# Patient Record
Sex: Female | Born: 1990 | Race: Black or African American | Hispanic: No | Marital: Single | Smoking: Current every day smoker
Health system: Southern US, Community
[De-identification: ages and names within clinical notes are randomized; demographics above are authoritative.]

## PROBLEM LIST (undated history)

## (undated) DIAGNOSIS — N201 Calculus of ureter: Secondary | ICD-10-CM

## (undated) DIAGNOSIS — M1990 Unspecified osteoarthritis, unspecified site: Secondary | ICD-10-CM

## (undated) DIAGNOSIS — R300 Dysuria: Secondary | ICD-10-CM

## (undated) DIAGNOSIS — N3289 Other specified disorders of bladder: Secondary | ICD-10-CM

## (undated) DIAGNOSIS — T8859XA Other complications of anesthesia, initial encounter: Secondary | ICD-10-CM

## (undated) DIAGNOSIS — K219 Gastro-esophageal reflux disease without esophagitis: Secondary | ICD-10-CM

---
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
## 2012-09-17 ENCOUNTER — Ambulatory Visit: Payer: Self-pay | Admitting: Orthopedic Surgery

## 2013-07-21 ENCOUNTER — Emergency Department (HOSPITAL_COMMUNITY)
Admission: EM | Admit: 2013-07-21 | Discharge: 2013-07-21 | Discharge status: Against Medical Advice | Payer: Medicaid Other | Attending: Emergency Medicine | Admitting: Emergency Medicine

## 2013-07-21 ENCOUNTER — Encounter (HOSPITAL_COMMUNITY): Payer: Self-pay | Admitting: Emergency Medicine

## 2013-07-21 DIAGNOSIS — R197 Diarrhea, unspecified: Secondary | ICD-10-CM | POA: Insufficient documentation

## 2013-07-21 DIAGNOSIS — R1110 Vomiting, unspecified: Secondary | ICD-10-CM | POA: Insufficient documentation

## 2013-07-21 DIAGNOSIS — R109 Unspecified abdominal pain: Secondary | ICD-10-CM | POA: Insufficient documentation

## 2013-07-21 DIAGNOSIS — F17200 Nicotine dependence, unspecified, uncomplicated: Secondary | ICD-10-CM | POA: Insufficient documentation

## 2013-07-21 NOTE — ED Notes (Signed)
Pt with abd pain with N/V/D since last night

## 2013-07-21 NOTE — ED Notes (Signed)
Reported that pt left.  

## 2014-08-21 ENCOUNTER — Emergency Department (HOSPITAL_COMMUNITY)
Admission: EM | Admit: 2014-08-21 | Discharge: 2014-08-21 | Disposition: A | Discharge status: Home / Self Care | Payer: Medicaid Other | Attending: Emergency Medicine | Admitting: Emergency Medicine

## 2014-08-21 ENCOUNTER — Encounter (HOSPITAL_COMMUNITY): Payer: Self-pay | Admitting: *Deleted

## 2014-08-21 DIAGNOSIS — Z79899 Other long term (current) drug therapy: Secondary | ICD-10-CM | POA: Insufficient documentation

## 2014-08-21 DIAGNOSIS — Z720 Tobacco use: Secondary | ICD-10-CM | POA: Insufficient documentation

## 2014-08-21 DIAGNOSIS — A5901 Trichomonal vulvovaginitis: Secondary | ICD-10-CM | POA: Insufficient documentation

## 2014-08-21 LAB — WET PREP, GENITAL: YEAST WET PREP: NONE SEEN

## 2014-08-21 MED ORDER — METRONIDAZOLE 500 MG PO TABS: 500.0000 mg | ORAL_TABLET | Freq: Two times a day (BID) | ORAL | Status: DC

## 2014-08-21 NOTE — Discharge Instructions (Signed)
Be sure your sex partner if treated for trichomonas. Men do not usually have symptoms and can re infect you if not treated. Follow up with the health department for additional screening such as HIV, Hepatitis and Syphilis.  We will only call you if you cultures for GC and Chlamydia are positive.   Trichomoniasis Trichomoniasis is an infection caused by an organism called Trichomonas. The infection can affect both women and men. In women, the outer female genitalia and the vagina are affected. In men, the penis is mainly affected, but the prostate and other reproductive organs can also be involved. Trichomoniasis is a sexually transmitted infection (STI) and is most often passed to another person through sexual contact.  RISK FACTORS  Having unprotected sexual intercourse.  Having sexual intercourse with an infected partner. SIGNS AND SYMPTOMS  Symptoms of trichomoniasis in women include:  Abnormal gray-green frothy vaginal discharge.  Itching and irritation of the vagina.  Itching and irritation of the area outside the vagina. Symptoms of trichomoniasis in men include:   Penile discharge with or without pain.  Pain during urination. This results from inflammation of the urethra. DIAGNOSIS  Trichomoniasis may be found during a Pap test or physical exam. Your health care provider may use one of the following methods to help diagnose this infection:  Examining vaginal discharge under a microscope. For men, urethral discharge would be examined.  Testing the pH of the vagina with a test tape.  Using a vaginal swab test that checks for the Trichomonas organism. A test is available that provides results within a few minutes.  Doing a culture test for the organism. This is not usually needed. TREATMENT   You may be given medicine to fight the infection. Women should inform their health care provider if they could be or are pregnant. Some medicines used to treat the infection should not be  taken during pregnancy.  Your health care provider may recommend over-the-counter medicines or creams to decrease itching or irritation.  Your sexual partner will need to be treated if infected. HOME CARE INSTRUCTIONS   Take medicines only as directed by your health care provider.  Take over-the-counter medicine for itching or irritation as directed by your health care provider.  Do not have sexual intercourse while you have the infection.  Women should not douche or wear tampons while they have the infection.  Discuss your infection with your partner. Your partner may have gotten the infection from you, or you may have gotten it from your partner.  Have your sex partner get examined and treated if necessary.  Practice safe, informed, and protected sex.  See your health care provider for other STI testing. SEEK MEDICAL CARE IF:   You still have symptoms after you finish your medicine.  You develop abdominal pain.  You have pain when you urinate.  You have bleeding after sexual intercourse.  You develop a rash.  Your medicine makes you sick or makes you throw up (vomit). MAKE SURE YOU:  Understand these instructions.  Will watch your condition.  Will get help right away if you are not doing well or get worse. Document Released: 01/17/2001 Document Revised: 12/08/2013 Document Reviewed: 05/05/2013 [facility] Patient Information 2015 [place], [place]. This information is not intended to replace advice given to you by your health care provider. Make sure you discuss any questions you have with your health care provider.  Sexually Transmitted Disease A sexually transmitted disease (STD) is a disease or infection often passed to another person  during sex. However, STDs can be passed through nonsexual ways. An STD can be passed through:  Spit (saliva).  Semen.  Blood.  Mucus from the vagina.  Pee (urine). HOW CAN I LESSEN MY CHANCES OF GETTING AN STD?  Use:  Latex  condoms.  Water-soluble lubricants with condoms. Do not use petroleum jelly or oils.  Dental dams. These are small pieces of latex that are used as a barrier during oral sex.  Avoid having more than one sex partner.  Do not have sex with someone who has other sex partners.  Do not have sex with anyone you do not know or who is at high risk for an STD.  Avoid risky sex that can break your skin.  Do not have sex if you have open sores on your mouth or skin.  Avoid drinking too much alcohol or taking illegal drugs. Alcohol and drugs can affect your good judgment.  Avoid oral and anal sex acts.  Get shots (vaccines) for HPV and hepatitis.  If you are at risk of being infected with HIV, it is advised that you take a certain medicine daily to prevent HIV infection. This is called pre-exposure prophylaxis (PrEP). You may be at risk if:  You are a man who has sex with other men (MSM).  You are attracted to the opposite sex (heterosexual) and are having sex with more than one partner.  You take drugs with a needle.  You have sex with someone who has HIV.  Talk with your doctor about if you are at high risk of being infected with HIV. If you begin to take PrEP, get tested for HIV first. Get tested every 3 months for as long as you are taking PrEP. WHAT SHOULD I DO IF I THINK I HAVE AN STD?  See your doctor.  Tell your sex partner(s) that you have an STD. They should be tested and treated.  Do not have sex until your doctor says it is okay. WHEN SHOULD I GET HELP? Get help right away if:  You have bad belly (abdominal) pain.  You are a man and have puffiness (swelling) or pain in your testicles.  You are a woman and have puffiness in your vagina. Document Released: 08/31/2004 Document Revised: 07/29/2013 Document Reviewed: 01/17/2013 [facility] Patient Information 2015 [place], [place]. This information is not intended to replace advice given to you by your health care provider.  Make sure you discuss any questions you have with your health care provider.

## 2014-08-21 NOTE — ED Notes (Signed)
Pt co vaginal rash/redness with white discharge, started Sunday.

## 2014-08-21 NOTE — ED Notes (Signed)
Vag d/c for several days, pt thinks due to change in fabric softener.  Says she is "very irritated and itchy".

## 2014-08-21 NOTE — ED Provider Notes (Signed)
CSN: [id number]     Arrival date & time 08/21/14  1154 History   First MD Initiated Contact with Patient 08/21/14 1240     Chief Complaint  Patient presents with  . Vaginal Discharge     (Consider location/radiation/quality/duration/timing/severity/associated sxs/prior Treatment) Patient is a 24 y.o. female presenting with vaginal discharge. The history is provided by the patient.  Vaginal Discharge Quality:  White Onset quality:  Gradual Duration:  5 days Timing:  Constant Progression:  Worsening Chronicity:  New Relieved by:  Nothing  Natalie Drucie OpitzM Raudenbush is a 24 y.o. G0, who presents to the ED with vaginal itching and discharge that started 5 days ago and is getting worse. Last sexual intercourse was 2 months ago. She uses Depo Provera for birth control. She reports the d/c as watery and severe itching in the vaginal area.   History reviewed. No pertinent past medical history. History reviewed. No pertinent past surgical history. History reviewed. No pertinent family history. History  Substance Use Topics  . Smoking status: Current Every Day Smoker -- 0.50 packs/day    Types: Cigarettes  . Smokeless tobacco: Not on file  . Alcohol Use: No   OB History    No data available     Review of Systems  Genitourinary: Positive for vaginal discharge.  all other systems negataive    Allergies  Review of patient's allergies indicates no known allergies.  Home Medications   Prior to Admission medications   Medication Sig Start Date End Date Taking? Authorizing Provider  metroNIDAZOLE (FLAGYL) 500 MG tablet Take 1 tablet (500 mg total) by mouth 2 (two) times daily. 08/21/14   Hope Orlene OchM Neese, NP   BP 129/82 mmHg  Pulse 86  Temp(Src) 98.9 F (37.2 C) (Oral)  Resp 16  Ht 5\' 5"  (1.651 m)  Wt 146 lb (66.225 kg)  BMI 24.30 kg/m2  SpO2 100% Physical Exam  Constitutional: She is oriented to person, place, and time. She appears well-developed and well-nourished.  HENT:  Head:  Normocephalic.  Eyes: EOM are normal.  Neck: Normal range of motion. Neck supple.  Cardiovascular: Normal rate.   Pulmonary/Chest: Effort normal.  Abdominal: Soft. There is no tenderness.  Genitourinary:  External genitalia without lesions, frothy discharge vaginal vault, cervix inflamed, no CMT, no adnexal mass or tenderness palpated. Uterus without palpable enlargement.   Musculoskeletal: Normal range of motion.  Neurological: She is alert and oriented to person, place, and time. No cranial nerve deficit.  Skin: Skin is warm and dry.  Psychiatric: She has a normal mood and affect. Her behavior is normal.  Nursing note and vitals reviewed.   ED Course  Procedures (including critical care time) I discussed with the patient that clinical findings are concerning for trichomonas. Will send wet prep and cultures.   Labs Review Labs Reviewed  WET PREP, GENITAL - Abnormal; Notable for the following:    Trich, Wet Prep MODERATE (*)    Clue Cells Wet Prep HPF POC FEW (*)    WBC, Wet Prep HPF POC TOO NUMEROUS TO COUNT (*)    All other components within normal limits  GC/CHLAMYDIA PROBE AMP ([place])    MDM  24 y.o. female with vaginal d/c and itching for several days. Stable for discharge without pelvic pain, fever or PID symptoms. Will treat with Flagyl for trichomonas and she will follow up with the health department for further blood test for STD's. We will call if GC or Chlamydia are positive. Discussed with the patient  and all questioned fully answered. She will return if any problems arise.   Final diagnoses:  Trichomonas vaginitis      Janne Napoleon, NP 08/21/14 1724  Benny Lennert, MD 08/24/14 [phone]

## 2014-08-24 LAB — GC/CHLAMYDIA PROBE AMP (~~LOC~~) NOT AT ARMC
Chlamydia: NEGATIVE
NEISSERIA GONORRHEA: NEGATIVE

## 2017-11-13 ENCOUNTER — Encounter (HOSPITAL_COMMUNITY): Payer: Self-pay

## 2017-11-13 ENCOUNTER — Other Ambulatory Visit: Payer: Self-pay

## 2017-11-13 ENCOUNTER — Emergency Department (HOSPITAL_COMMUNITY)
Admission: EM | Admit: 2017-11-13 | Discharge: 2017-11-13 | Disposition: A | Discharge status: Home / Self Care | Payer: Medicaid Other | Attending: Emergency Medicine | Admitting: Emergency Medicine

## 2017-11-13 ENCOUNTER — Emergency Department (HOSPITAL_COMMUNITY): Payer: Medicaid Other

## 2017-11-13 DIAGNOSIS — F17210 Nicotine dependence, cigarettes, uncomplicated: Secondary | ICD-10-CM | POA: Insufficient documentation

## 2017-11-13 DIAGNOSIS — M542 Cervicalgia: Secondary | ICD-10-CM | POA: Diagnosis not present

## 2017-11-13 DIAGNOSIS — Y999 Unspecified external cause status: Secondary | ICD-10-CM | POA: Insufficient documentation

## 2017-11-13 DIAGNOSIS — Y9389 Activity, other specified: Secondary | ICD-10-CM | POA: Insufficient documentation

## 2017-11-13 DIAGNOSIS — Y92411 Interstate highway as the place of occurrence of the external cause: Secondary | ICD-10-CM | POA: Diagnosis not present

## 2017-11-13 DIAGNOSIS — M7918 Myalgia, other site: Secondary | ICD-10-CM

## 2017-11-13 DIAGNOSIS — M549 Dorsalgia, unspecified: Secondary | ICD-10-CM | POA: Insufficient documentation

## 2017-11-13 DIAGNOSIS — M25561 Pain in right knee: Secondary | ICD-10-CM | POA: Insufficient documentation

## 2017-11-13 LAB — POC URINE PREG, ED: Preg Test, Ur: NEGATIVE

## 2017-11-13 MED ORDER — IBUPROFEN 600 MG PO TABS: 600.0000 mg | ORAL_TABLET | Freq: Four times a day (QID) | ORAL | 0 refills | Status: DC | PRN

## 2017-11-13 MED ORDER — CYCLOBENZAPRINE HCL 5 MG PO TABS: 5.0000 mg | ORAL_TABLET | Freq: Three times a day (TID) | ORAL | 0 refills | Status: DC | PRN

## 2017-11-13 MED ORDER — ACETAMINOPHEN 325 MG PO TABS
650.0000 mg | ORAL_TABLET | Freq: Once | ORAL | Status: AC
  Administered 2017-11-13: 650 mg via ORAL
  Filled 2017-11-13: qty 2
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
## 2020-08-25 DIAGNOSIS — Z8616 Personal history of COVID-19: Secondary | ICD-10-CM

## 2020-08-25 HISTORY — DX: Personal history of COVID-19: Z86.16

## 2021-03-21 ENCOUNTER — Encounter (HOSPITAL_COMMUNITY)
Admission: EM | Disposition: A | Discharge status: Home / Self Care | Payer: Self-pay | Source: Home / Self Care | Attending: Emergency Medicine

## 2021-03-21 ENCOUNTER — Emergency Department (HOSPITAL_COMMUNITY): Payer: Medicaid Other | Admitting: Certified Registered"

## 2021-03-21 ENCOUNTER — Ambulatory Visit (HOSPITAL_COMMUNITY)
Admission: EM | Admit: 2021-03-21 | Discharge: 2021-03-21 | Disposition: A | Discharge status: Home / Self Care | Payer: Medicaid Other | Attending: Emergency Medicine | Admitting: Emergency Medicine

## 2021-03-21 ENCOUNTER — Emergency Department (HOSPITAL_COMMUNITY): Payer: Medicaid Other

## 2021-03-21 ENCOUNTER — Encounter (HOSPITAL_COMMUNITY): Payer: Self-pay

## 2021-03-21 ENCOUNTER — Other Ambulatory Visit: Payer: Self-pay

## 2021-03-21 DIAGNOSIS — N136 Pyonephrosis: Secondary | ICD-10-CM | POA: Diagnosis not present

## 2021-03-21 DIAGNOSIS — F17210 Nicotine dependence, cigarettes, uncomplicated: Secondary | ICD-10-CM | POA: Insufficient documentation

## 2021-03-21 DIAGNOSIS — N200 Calculus of kidney: Secondary | ICD-10-CM

## 2021-03-21 HISTORY — PX: CYSTOSCOPY W/ URETERAL STENT PLACEMENT: SHX1429

## 2021-03-21 LAB — CBC WITH DIFFERENTIAL/PLATELET
Abs Immature Granulocytes: 0.03 10*3/uL (ref 0.00–0.07)
Basophils Absolute: 0 10*3/uL (ref 0.0–0.1)
Basophils Relative: 0 %
Eosinophils Absolute: 0 10*3/uL (ref 0.0–0.5)
Eosinophils Relative: 1 %
HCT: 37.4 % (ref 36.0–46.0)
Hemoglobin: 11.9 g/dL — ABNORMAL LOW (ref 12.0–15.0)
Immature Granulocytes: 1 %
Lymphocytes Relative: 15 %
Lymphs Abs: 0.9 10*3/uL (ref 0.7–4.0)
MCH: 31.4 pg (ref 26.0–34.0)
MCHC: 31.8 g/dL (ref 30.0–36.0)
MCV: 98.7 fL (ref 80.0–100.0)
Monocytes Absolute: 0.6 10*3/uL (ref 0.1–1.0)
Monocytes Relative: 10 %
Neutro Abs: 4.6 10*3/uL (ref 1.7–7.7)
Neutrophils Relative %: 73 %
Platelets: 252 10*3/uL (ref 150–400)
RBC: 3.79 MIL/uL — ABNORMAL LOW (ref 3.87–5.11)
RDW: 12 % (ref 11.5–15.5)
WBC: 6.2 10*3/uL (ref 4.0–10.5)
nRBC: 0 % (ref 0.0–0.2)

## 2021-03-21 LAB — COMPREHENSIVE METABOLIC PANEL
ALT: 11 U/L (ref 0–44)
AST: 15 U/L (ref 15–41)
Albumin: 3.9 g/dL (ref 3.5–5.0)
Alkaline Phosphatase: 54 U/L (ref 38–126)
Anion gap: 10 (ref 5–15)
BUN: 10 mg/dL (ref 6–20)
CO2: 25 mmol/L (ref 22–32)
Calcium: 9.4 mg/dL (ref 8.9–10.3)
Chloride: 102 mmol/L (ref 98–111)
Creatinine, Ser: 1.28 mg/dL — ABNORMAL HIGH (ref 0.44–1.00)
GFR, Estimated: 59 mL/min — ABNORMAL LOW (ref >60)
Glucose, Bld: 91 mg/dL (ref 70–99)
Potassium: 3.9 mmol/L (ref 3.5–5.1)
Sodium: 137 mmol/L (ref 135–145)
Total Bilirubin: 0.7 mg/dL (ref 0.3–1.2)
Total Protein: 8.1 g/dL (ref 6.5–8.1)

## 2021-03-21 LAB — PREGNANCY, URINE: Preg Test, Ur: NEGATIVE

## 2021-03-21 LAB — URINALYSIS, ROUTINE W REFLEX MICROSCOPIC
Bilirubin Urine: NEGATIVE
Glucose, UA: NEGATIVE mg/dL
Ketones, ur: 80 mg/dL — AB
Nitrite: NEGATIVE
Protein, ur: 30 mg/dL — AB
Specific Gravity, Urine: 1.026 (ref 1.005–1.030)
pH: 5 (ref 5.0–8.0)

## 2021-03-21 SURGERY — CYSTOSCOPY, WITH RETROGRADE PYELOGRAM AND URETERAL STENT INSERTION
Anesthesia: General | Site: Urethra | Laterality: Left

## 2021-03-21 MED ORDER — HYDROMORPHONE HCL 1 MG/ML IJ SOLN
1.0000 mg | Freq: Once | INTRAMUSCULAR | Status: DC
  Filled 2021-03-21: qty 1

## 2021-03-21 MED ORDER — DEXAMETHASONE SODIUM PHOSPHATE 10 MG/ML IJ SOLN
INTRAMUSCULAR | Status: DC | PRN
  Administered 2021-03-21: 10 mg via INTRAVENOUS

## 2021-03-21 MED ORDER — FENTANYL CITRATE (PF) 100 MCG/2ML IJ SOLN
50.0000 ug | Freq: Once | INTRAMUSCULAR | Status: AC
  Administered 2021-03-21: 50 ug via INTRAVENOUS
  Filled 2021-03-21: qty 2

## 2021-03-21 MED ORDER — WATER FOR IRRIGATION, STERILE IR SOLN
Status: DC | PRN
  Administered 2021-03-21: 3000 mL

## 2021-03-21 MED ORDER — KETOROLAC TROMETHAMINE 10 MG PO TABS: 10.0000 mg | ORAL_TABLET | Freq: Four times a day (QID) | ORAL | 0 refills | Status: AC | PRN

## 2021-03-21 MED ORDER — MIDAZOLAM HCL 2 MG/2ML IJ SOLN
INTRAMUSCULAR | Status: AC
  Filled 2021-03-21: qty 2

## 2021-03-21 MED ORDER — ONDANSETRON HCL 4 MG/2ML IJ SOLN
INTRAMUSCULAR | Status: DC | PRN
  Administered 2021-03-21: 4 mg via INTRAVENOUS

## 2021-03-21 MED ORDER — PHENAZOPYRIDINE HCL 200 MG PO TABS: 200.0000 mg | ORAL_TABLET | Freq: Three times a day (TID) | ORAL | 0 refills | Status: AC | PRN

## 2021-03-21 MED ORDER — OXYCODONE HCL 5 MG PO TABS: 5.0000 mg | ORAL_TABLET | Freq: Once | ORAL | Status: DC | PRN

## 2021-03-21 MED ORDER — OXYCODONE HCL 5 MG/5ML PO SOLN
5.0000 mg | Freq: Once | ORAL | Status: DC | PRN
Start: 2021-03-21 — End: 2021-03-22

## 2021-03-21 MED ORDER — ONDANSETRON HCL 4 MG/2ML IJ SOLN
4.0000 mg | Freq: Once | INTRAMUSCULAR | Status: AC
  Administered 2021-03-21: 4 mg via INTRAVENOUS
  Filled 2021-03-21: qty 2

## 2021-03-21 MED ORDER — LACTATED RINGERS IV SOLN
INTRAVENOUS | Status: DC | PRN
Start: 2021-03-21 — End: 2021-03-21

## 2021-03-21 MED ORDER — MORPHINE SULFATE (PF) 4 MG/ML IV SOLN
4.0000 mg | Freq: Once | INTRAVENOUS | Status: AC
Start: 2021-03-21 — End: 2021-03-21
  Administered 2021-03-21: 4 mg via INTRAVENOUS
  Filled 2021-03-21: qty 1

## 2021-03-21 MED ORDER — PROMETHAZINE HCL 25 MG/ML IJ SOLN
6.2500 mg | INTRAMUSCULAR | Status: DC | PRN
Start: 2021-03-21 — End: 2021-03-22

## 2021-03-21 MED ORDER — CEPHALEXIN 500 MG PO CAPS: 500.0000 mg | ORAL_CAPSULE | Freq: Two times a day (BID) | ORAL | 0 refills | Status: AC

## 2021-03-21 MED ORDER — PROPOFOL 10 MG/ML IV BOLUS
INTRAVENOUS | Status: DC | PRN
  Administered 2021-03-21: 120 mg via INTRAVENOUS

## 2021-03-21 MED ORDER — SODIUM CHLORIDE 0.9 % IV SOLN
2.0000 g | Freq: Once | INTRAVENOUS | Status: AC
  Administered 2021-03-21: 2 g via INTRAVENOUS
  Filled 2021-03-21: qty 20

## 2021-03-21 MED ORDER — OXYBUTYNIN CHLORIDE 5 MG PO TABS: 5.0000 mg | ORAL_TABLET | Freq: Three times a day (TID) | ORAL | 1 refills | Status: AC | PRN

## 2021-03-21 MED ORDER — MIDAZOLAM HCL 5 MG/5ML IJ SOLN
INTRAMUSCULAR | Status: DC | PRN
  Administered 2021-03-21: 2 mg via INTRAVENOUS

## 2021-03-21 MED ORDER — FENTANYL CITRATE (PF) 100 MCG/2ML IJ SOLN
INTRAMUSCULAR | Status: AC
  Filled 2021-03-21: qty 2

## 2021-03-21 MED ORDER — ONDANSETRON HCL 4 MG PO TABS: 4.0000 mg | ORAL_TABLET | Freq: Every day | ORAL | 1 refills | Status: AC | PRN

## 2021-03-21 MED ORDER — IOHEXOL 300 MG/ML  SOLN
INTRAMUSCULAR | Status: DC | PRN
  Administered 2021-03-21: 10 mL via URETHRAL

## 2021-03-21 MED ORDER — FENTANYL CITRATE (PF) 100 MCG/2ML IJ SOLN: 25.0000 ug | INTRAMUSCULAR | Status: DC | PRN

## 2021-03-21 MED ORDER — FENTANYL CITRATE (PF) 100 MCG/2ML IJ SOLN
INTRAMUSCULAR | Status: DC | PRN
  Administered 2021-03-21 (×2): 50 ug via INTRAVENOUS

## 2021-03-21 MED ORDER — MORPHINE SULFATE (PF) 4 MG/ML IV SOLN
4.0000 mg | Freq: Once | INTRAVENOUS | Status: AC
  Administered 2021-03-21: 4 mg via INTRAVENOUS
  Filled 2021-03-21: qty 1

## 2021-03-21 MED ORDER — LIDOCAINE 2% (20 MG/ML) 5 ML SYRINGE
INTRAMUSCULAR | Status: DC | PRN
  Administered 2021-03-21: 60 mg via INTRAVENOUS

## 2021-03-21 MED ORDER — SODIUM CHLORIDE 0.9 % IV SOLN
1.5000 mg/kg | Freq: Once | INTRAVENOUS | Status: DC
  Filled 2021-03-21: qty 4.7

## 2021-03-21 SURGICAL SUPPLY — 12 items
BAG URO CATCHER STRL LF (MISCELLANEOUS) ×2 IMPLANT
CATH URET 5FR 28IN OPEN ENDED (CATHETERS) ×2 IMPLANT
CLOTH BEACON ORANGE TIMEOUT ST (SAFETY) ×2 IMPLANT
GLOVE SURG ENC TEXT LTX SZ7.5 (GLOVE) ×2 IMPLANT
GOWN STRL REUS W/TWL XL LVL3 (GOWN DISPOSABLE) ×2 IMPLANT
GUIDEWIRE STR DUAL SENSOR (WIRE) IMPLANT
GUIDEWIRE ZIPWRE .038 STRAIGHT (WIRE) ×2 IMPLANT
KIT TURNOVER KIT A (KITS) ×2 IMPLANT
MANIFOLD NEPTUNE II (INSTRUMENTS) ×2 IMPLANT
PACK CYSTO (CUSTOM PROCEDURE TRAY) ×2 IMPLANT
STENT URET 6FRX24 CONTOUR (STENTS) ×1 IMPLANT
TUBING CONNECTING 10 (TUBING) ×2 IMPLANT

## 2021-03-21 NOTE — H&P (Signed)
H&P  Chief Complaint: Left flank pain  History of Present Illness: Natalie Schaefer is a 30 y.o. year old female with an obstructing 7 mm left mid ureteral calculus associated with left-sided hydronephrosis and renal colic.  The patient reports a 1 week history of intermittent episodes of sharp left-sided flank pain associated with nausea/vomiting and chills.  She denies subjective fevers at home and remains afebrile in the ER today.  She was actually seen 1 week ago at the [facility] emergency department for similar symptoms and was discharged with medical expulsive therapy.  UA today shows many bacteria, large leukocytes and microscopic hematuria.  She has no prior history of kidney stones or GU related surgery/trauma.  History reviewed. No pertinent past medical history.  History reviewed. No pertinent surgical history.  Home Medications:  No outpatient medications have been marked as taking for the 03/21/21 encounter [facility] Encounter).    Allergies: No Known Allergies  History reviewed. No pertinent family history.  Social History:  reports that she has been smoking cigarettes. She has been smoking an average of .5 packs per day. She has never used smokeless tobacco. She reports that she does not currently use drugs. She reports that she does not drink alcohol.  ROS: A complete review of systems was performed.  All systems are negative except for pertinent findings as noted.  Physical Exam:  Vital signs in last 24 hours: Temp:  [98.7 F (37.1 C)] 98.7 F (37.1 C) (08/15 1345) Pulse Rate:  [64-104] 104 (08/15 1930) Resp:  [16-18] 18 (08/15 1900) BP: (119-154)/(76-106) 133/77 (08/15 1900) SpO2:  [98 %-100 %] 100 % (08/15 1930) Weight:  [62.6 kg] 62.6 kg (08/15 1819) Constitutional:  Alert and oriented, No acute distress Cardiovascular: Regular rate and rhythm, No JVD Respiratory: Normal respiratory effort, Lungs clear bilaterally GI: Abdomen is soft, nontender, nondistended, no  abdominal masses GU: Left CVA tenderness Lymphatic: No lymphadenopathy Neurologic: Grossly intact, no focal deficits Psychiatric: Normal mood and affect    Laboratory Data:  Recent Labs    03/21/21 1517  WBC 6.2  HGB 11.9*  HCT 37.4  PLT 252    Recent Labs    03/21/21 1517  NA 137  K 3.9  CL 102  GLUCOSE 91  BUN 10  CALCIUM 9.4  CREATININE 1.28*     Results for orders placed or performed during the hospital encounter of 03/21/21 (from the past 24 hour(s))  CBC with Differential     Status: Abnormal   Collection Time: 03/21/21  3:17 PM  Result Value Ref Range   WBC 6.2 4.0 - 10.5 K/uL   RBC 3.79 (L) 3.87 - 5.11 MIL/uL   Hemoglobin 11.9 (L) 12.0 - 15.0 g/dL   HCT 10.6 26.9 - 48.5 %   MCV 98.7 80.0 - 100.0 fL   MCH 31.4 26.0 - 34.0 pg   MCHC 31.8 30.0 - 36.0 g/dL   RDW 46.2 70.3 - 50.0 %   Platelets 252 150 - 400 K/uL   nRBC 0.0 0.0 - 0.2 %   Neutrophils Relative % 73 %   Neutro Abs 4.6 1.7 - 7.7 K/uL   Lymphocytes Relative 15 %   Lymphs Abs 0.9 0.7 - 4.0 K/uL   Monocytes Relative 10 %   Monocytes Absolute 0.6 0.1 - 1.0 K/uL   Eosinophils Relative 1 %   Eosinophils Absolute 0.0 0.0 - 0.5 K/uL   Basophils Relative 0 %   Basophils Absolute 0.0 0.0 - 0.1 K/uL   Immature  Granulocytes 1 %   Abs Immature Granulocytes 0.03 0.00 - 0.07 K/uL  Comprehensive metabolic panel     Status: Abnormal   Collection Time: 03/21/21  3:17 PM  Result Value Ref Range   Sodium 137 135 - 145 mmol/L   Potassium 3.9 3.5 - 5.1 mmol/L   Chloride 102 98 - 111 mmol/L   CO2 25 22 - 32 mmol/L   Glucose, Bld 91 70 - 99 mg/dL   BUN 10 6 - 20 mg/dL   Creatinine, Ser 1.61 (H) 0.44 - 1.00 mg/dL   Calcium 9.4 8.9 - 09.6 mg/dL   Total Protein 8.1 6.5 - 8.1 g/dL   Albumin 3.9 3.5 - 5.0 g/dL   AST 15 15 - 41 U/L   ALT 11 0 - 44 U/L   Alkaline Phosphatase 54 38 - 126 U/L   Total Bilirubin 0.7 0.3 - 1.2 mg/dL   GFR, Estimated 59 (L) >60 mL/min   Anion gap 10 5 - 15  Urinalysis, Routine w  reflex microscopic Urine, Clean Catch     Status: Abnormal   Collection Time: 03/21/21  3:29 PM  Result Value Ref Range   Color, Urine YELLOW YELLOW   APPearance CLOUDY (A) CLEAR   Specific Gravity, Urine 1.026 1.005 - 1.030   pH 5.0 5.0 - 8.0   Glucose, UA NEGATIVE NEGATIVE mg/dL   Hgb urine dipstick SMALL (A) NEGATIVE   Bilirubin Urine NEGATIVE NEGATIVE   Ketones, ur 80 (A) NEGATIVE mg/dL   Protein, ur 30 (A) NEGATIVE mg/dL   Nitrite NEGATIVE NEGATIVE   Leukocytes,Ua LARGE (A) NEGATIVE   RBC / HPF 21-50 0 - 5 RBC/hpf   WBC, UA 21-50 0 - 5 WBC/hpf   Bacteria, UA MANY (A) NONE SEEN   Squamous Epithelial / LPF 21-50 0 - 5   Mucus PRESENT   Pregnancy, urine     Status: None   Collection Time: 03/21/21  3:29 PM  Result Value Ref Range   Preg Test, Ur NEGATIVE NEGATIVE   No results found for this or any previous visit (from the past 240 hour(s)).  Renal Function: Recent Labs    03/21/21 1517  CREATININE 1.28*   Estimated Creatinine Clearance: 59.4 mL/min (A) (by C-G formula based on SCr of 1.28 mg/dL (H)).  Radiologic Imaging: CT Renal Stone Study  Result Date: 03/21/2021 CLINICAL DATA:  Left flank pain.  Nausea, vomiting EXAM: CT ABDOMEN AND PELVIS WITHOUT CONTRAST TECHNIQUE: Multidetector CT imaging of the abdomen and pelvis was performed following the standard protocol without IV contrast. COMPARISON:  03/16/2021 FINDINGS: Lower chest: No acute abnormality. Hepatobiliary: No focal liver abnormality is seen. No gallstones, gallbladder wall thickening, or biliary dilatation. Pancreas: Unremarkable. No pancreatic ductal dilatation or surrounding inflammatory changes. Spleen: Normal in size without focal abnormality. Adrenals/Urinary Tract: Normal adrenal glands. 7 mm mid left ureteral calculus resulting in moderate left hydronephrosis. No other urolithiasis. No renal mass. Decompressed bladder. Stomach/Bowel: Stomach is within normal limits. Appendix appears normal. No evidence of  bowel wall thickening, distention, or inflammatory changes. Vascular/Lymphatic: No significant vascular findings are present. No enlarged abdominal or pelvic lymph nodes. Reproductive: Uterus and bilateral adnexa are unremarkable. Other: No abdominal wall hernia or abnormality. No abdominopelvic ascites. Musculoskeletal: No acute or significant osseous findings. Bilateral L5 transverse processes articulate with the sacrum. IMPRESSION: 1. A 7 mm mid left ureteral calculus resulting in moderate left hydronephrosis. Electronically Signed   By: Elige Ko M.D.   On: 03/21/2021 17:44  Assessment:  30 year old female with an obstructing 7 mm left mid ureteral calculus with left-sided hydronephrosis and left renal colic  Plan:  -The risks, benefits and alternatives of cystoscopy with LEFT JJ stent placement was discussed with the patient.  Risks include, but are not limited to: bleeding, urinary tract infection, ureteral injury, ureteral stricture disease, chronic pain, urinary symptoms, bladder injury, stent migration, the need for nephrostomy tube placement, MI, CVA, DVT, PE and the inherent risks with general anesthesia.  The patient voices understanding and wishes to proceed.    Rhoderick Moody, MD 03/21/2021, 7:59 PM  [facility] Pager: [phone]

## 2021-03-21 NOTE — ED Notes (Addendum)
Blank consent form at bedside for OR team per request by OR staff.  Pt undressed out of clothes and dressed into hospital gown, jewelry and personal belongings all given to pt's mother who is at bedside.

## 2021-03-21 NOTE — ED Provider Notes (Signed)
[facility]-EMERGENCY DEPT Provider Note   CSN: [id number] Arrival date & time: 03/21/21  1338     History Chief Complaint  Patient presents with   Flank Pain   Emesis    Natalie Schaefer is a 30 y.o. female who presents emergency department chief complaint of left flank pain.  Patient was seen and evaluated at [facility] ED on 03/16/2021.  She was diagnosed with a kidney stone.  I reviewed outside records which shows a 7 mm proximal obstructing stone.  She was sent home with Zofran, Phenergan, Percocet, famotidine, and flomax. She has had near constant pain with waxing and waning but severe. She has had vomiting and uncontrolled pain. Patient reports that she has not been able to urinate for the past 3 days, but was just able to produce a very small amount of urine.    Flank Pain  Emesis     History reviewed. No pertinent past medical history.  There are no problems to display for this patient.   History reviewed. No pertinent surgical history.   OB History   No obstetric history on file.     History reviewed. No pertinent family history.  Social History   Tobacco Use   Smoking status: Every Day    Packs/day: 0.50    Types: Cigarettes   Smokeless tobacco: Never  Substance Use Topics   Alcohol use: No   Drug use: Not Currently    Home Medications Prior to Admission medications   Medication Sig Start Date End Date Taking? Authorizing Provider  cyclobenzaprine (FLEXERIL) 5 MG tablet Take 1 tablet (5 mg total) by mouth 3 (three) times daily as needed for muscle spasms. 11/13/17   Burgess Amor, PA-C  ibuprofen (ADVIL,MOTRIN) 600 MG tablet Take 1 tablet (600 mg total) by mouth every 6 (six) hours as needed. 11/13/17   Burgess Amor, PA-C    Allergies    Patient has no known allergies.  Review of Systems   Review of Systems  Gastrointestinal:  Positive for vomiting.  Genitourinary:  Positive for flank pain.  Ten systems reviewed and are  negative for acute change, except as noted in the HPI.   Physical Exam Updated Vital Signs BP (!) 154/102 (BP Location: Left Arm)   Pulse 80   Temp 98.7 F (37.1 C) (Oral)   Resp 18   SpO2 100%   Physical Exam Vitals and nursing note reviewed.  Constitutional:      General: She is not in acute distress.    Appearance: She is well-developed. She is not diaphoretic.  HENT:     Head: Normocephalic and atraumatic.     Right Ear: External ear normal.     Left Ear: External ear normal.     Nose: Nose normal.     Mouth/Throat:     Mouth: Mucous membranes are moist.  Eyes:     General: No scleral icterus.    Conjunctiva/sclera: Conjunctivae normal.  Cardiovascular:     Rate and Rhythm: Normal rate and regular rhythm.     Heart sounds: Normal heart sounds. No murmur heard.   No friction rub. No gallop.  Pulmonary:     Effort: Pulmonary effort is normal. No respiratory distress.     Breath sounds: Normal breath sounds.  Abdominal:     General: Bowel sounds are normal. There is no distension.     Palpations: Abdomen is soft. There is no mass.     Tenderness: There is no abdominal tenderness.  There is no right CVA tenderness, left CVA tenderness or guarding.  Musculoskeletal:     Cervical back: Normal range of motion.  Skin:    General: Skin is warm and dry.  Neurological:     Mental Status: She is alert and oriented to person, place, and time.  Psychiatric:        Behavior: Behavior normal.    ED Results / Procedures / Treatments   Labs (all labs ordered are listed, but only abnormal results are displayed) Labs Reviewed  CBC WITH DIFFERENTIAL/PLATELET - Abnormal; Notable for the following components:      Result Value   RBC 3.79 (*)    Hemoglobin 11.9 (*)    All other components within normal limits  COMPREHENSIVE METABOLIC PANEL - Abnormal; Notable for the following components:   Creatinine, Ser 1.28 (*)    GFR, Estimated 59 (*)    All other components within normal  limits  URINALYSIS, ROUTINE W REFLEX MICROSCOPIC  PREGNANCY, URINE    EKG None  Radiology No results found.  Procedures Procedures   Medications Ordered in ED Medications - No data to display  ED Course  I have reviewed the triage vital signs and the nursing notes.  Pertinent labs & imaging results that were available during my care of the patient were reviewed by me and considered in my medical decision making (see chart for details).    MDM Rules/Calculators/A&P                           Patient here with known 7 mm kidney stone and intractable flank pain and vomiting.  I ordered and reviewed labs that included CBC without elevated white blood cell count, CMP with elevated creatinine of 1.28, down from 1.345 days ago.  Patient's pregnancy test is negative.  Urinalysis appears infected.  I ordered and reviewed a repeat CT renal stone study which shows that the stone has moved to the mid ureter.  She still has significant hydronephrosis.  Case discussed with Dr. Sande Brothers who will admit the patient for intervention.  She has gotten some pain medication with only minimal improvement in her pain.  I have also ordered IV lidocaine for renal colic. Final Clinical Impression(s) / ED Diagnoses Final diagnoses:  None    Rx / DC Orders ED Discharge Orders     None        Arthor Captain, PA-C 03/21/21 2350    Franne Forts, DO 03/24/21 1634

## 2021-03-21 NOTE — Anesthesia Procedure Notes (Signed)
Procedure Name: LMA Insertion Date/Time: 03/21/2021 8:42 PM Performed by: Ponciano Ort, CRNA Pre-anesthesia Checklist: Patient identified, Emergency Drugs available, Suction available and Patient being monitored Patient Re-evaluated:Patient Re-evaluated prior to induction Oxygen Delivery Method: Circle system utilized Preoxygenation: Pre-oxygenation with 100% oxygen Induction Type: IV induction Ventilation: Mask ventilation without difficulty LMA: LMA inserted LMA Size: 4.0 Tube type: Oral Number of attempts: 1 Placement Confirmation: positive ETCO2 and breath sounds checked- equal and bilateral Tube secured with: Tape Dental Injury: Teeth and Oropharynx as per pre-operative assessment

## 2021-03-21 NOTE — ED Triage Notes (Signed)
Pt reports being diagnosed with a kidney stone on Tuesday and is scheduled to see urology tomorrow, but was told to come here if the pain became worse. Pt endorses increased pain and unable to have a bowel movement of urinate in 4 days. Pt also reports emesis and being unable to keep anything down.

## 2021-03-21 NOTE — Transfer of Care (Signed)
Immediate Anesthesia Transfer of Care Note  Patient: Natalie Schaefer  Procedure(s) Performed: CYSTOSCOPY WITH RETROGRADE PYELOGRAM/URETERAL STENT PLACEMENT (Left: Urethra)  Patient Location: PACU  Anesthesia Type:General  Level of Consciousness: awake, alert , oriented and patient cooperative  Airway & Oxygen Therapy: Patient Spontanous Breathing and Patient connected to face mask oxygen  Post-op Assessment: Report given to RN and Post -op Vital signs reviewed and stable  Post vital signs: Reviewed and stable  Last Vitals:  Vitals Value Taken Time  BP 123/80 03/21/21 2122  Temp    Pulse 76 03/21/21 2124  Resp 18 03/21/21 2124  SpO2 100 % 03/21/21 2124  Vitals shown include unvalidated device data.  Last Pain:  Vitals:   03/21/21 1904  TempSrc:   PainSc: Asleep         Complications: No notable events documented.

## 2021-03-21 NOTE — Anesthesia Preprocedure Evaluation (Addendum)
Anesthesia Evaluation  Patient identified by MRN, date of birth, ID band Patient awake    Reviewed: Allergy & Precautions, NPO status , Patient's Chart, lab work & pertinent test results  History of Anesthesia Complications Negative for: history of anesthetic complications  Airway Mallampati: II  TM Distance: >3 FB Neck ROM: Full    Dental no notable dental hx.    Pulmonary Current Smoker,    Pulmonary exam normal        Cardiovascular negative cardio ROS Normal cardiovascular exam     Neuro/Psych negative neurological ROS  negative psych ROS   GI/Hepatic negative GI ROS, Neg liver ROS,   Endo/Other  negative endocrine ROS  Renal/GU left ureteral stone  negative genitourinary   Musculoskeletal negative musculoskeletal ROS (+)   Abdominal   Peds  Hematology  (+) anemia , Hgb 11.9   Anesthesia Other Findings Day of surgery medications reviewed with patient.  Reproductive/Obstetrics negative OB ROS                            Anesthesia Physical Anesthesia Plan  ASA: 2 and emergent  Anesthesia Plan: General   Post-op Pain Management:    Induction: Intravenous  PONV Risk Score and Plan: 3 and Treatment may vary due to age or medical condition, Ondansetron, Dexamethasone and Midazolam  Airway Management Planned: LMA  Additional Equipment: None  Intra-op Plan:   Post-operative Plan: Extubation in OR  Informed Consent: I have reviewed the patients History and Physical, chart, labs and discussed the procedure including the risks, benefits and alternatives for the proposed anesthesia with the patient or authorized representative who has indicated his/her understanding and acceptance.     Dental advisory given  Plan Discussed with: CRNA  Anesthesia Plan Comments:        Anesthesia Quick Evaluation

## 2021-03-21 NOTE — Op Note (Signed)
Operative Note  Preoperative diagnosis:  1.  7 mm left mid ureteral calculus 2.  UTI  Postoperative diagnosis: 1.  7 mm left mid ureteral calculus 2.  UTI  Procedure(s): 1.  Cystoscopy with left ureteral stent placement 2.  Left retrograde pyelogram with intraoperative interpretation of fluoroscopic imaging  Surgeon: Rhoderick Moody, MD  Assistants:  None  Anesthesia:  General  Complications:  None  EBL: Less than 5 mL  Specimens: 1.  None  Drains/Catheters: 1.  Left 6 French, 24 cm JJ stent without tether  Intraoperative findings:   Left retrograde pyelogram revealed a filling defect within the midportion of the left ureter, consistent with the obstructing stone seen on recent CT scan.  The proximal aspects of the left ureter and renal pelvis were uniformly dilated with no additional filling defects.  Indication:  Natalie Schaefer is a 30 y.o. female with left renal colic secondary to an obstructing left mid ureteral calculus measuring approximately 7 mm.  She has been consented for the above procedures, voices understanding and wishes to proceed.  Description of procedure:  After informed consent was obtained, the patient was brought to the operating room and general LMA anesthesia was administered. The patient was then placed in the dorsolithotomy position and prepped and draped in the usual sterile fashion. A timeout was performed. A 21 French rigid cystoscope was then inserted into the urethral meatus and advanced into the bladder under direct vision. A complete bladder survey revealed no intravesical pathology.  A 5 French ureteral catheter was then inserted into the left ureteral orifice and a retrograde pyelogram was obtained, with the findings listed above.  A Glidewire was then used to intubate the lumen of the ureteral catheter and was advanced up to the left renal pelvis, under fluoroscopic guidance.  The catheter was then removed, leaving the wire in place.  A 6  French, 24 cm ureteral stent was then advanced over the wire and into good position within the left collecting system, confirming placement via fluoroscopy.  The patient's bladder was then drained.  She tolerated the procedure well and was transferred to the postanesthesia in stable condition.  Plan: Follow-up in 1 week for a preop visit leading up to ureteroscopy in the next 2 to 3 weeks.

## 2021-03-21 NOTE — Discharge Instructions (Signed)
Post Anesthesia Instructions:

## 2021-03-21 NOTE — ED Provider Notes (Signed)
Emergency Medicine Provider Triage Evaluation Note  Natalie Schaefer , a 30 y.o. female  was evaluated in triage.  Pt complains of left flank pain associate with nausea and vomiting.  Patient was diagnosed with a kidney stone on 8/10.  Patient states she has had numerous episodes nonbloody, nonbilious emesis is unable to hold any of her medications down.  Patient has an appointment with urology tomorrow morning.  Patient had at CT scan on 8/10 which demonstrated: IMPRESSION: 7 mm proximal left ureteral obstructing calculus with mild left hydroureteronephrosis.  Review of Systems  Positive: Flank pain Negative:   Physical Exam  BP (!) 154/102 (BP Location: Left Arm)   Pulse 80   Temp 98.7 F (37.1 C) (Oral)   Resp 18   SpO2 100%  Gen:   Awake, no distress   Resp:  Normal effort MSK:   Moves extremities without difficulty  Other:    Medical Decision Making  Medically screening exam initiated at 2:18 PM.  Appropriate orders placed.  Natalie Schaefer was informed that the remainder of the evaluation will be completed by another provider, this initial triage assessment does not replace that evaluation, and the importance of remaining in the ED until their evaluation is complete.  labs   Jesusita Oka 03/21/21 1422    Derwood Kaplan, MD 03/22/21 1104

## 2021-03-22 ENCOUNTER — Encounter (HOSPITAL_COMMUNITY): Payer: Self-pay | Admitting: Urology

## 2021-03-22 ENCOUNTER — Other Ambulatory Visit: Payer: Self-pay | Admitting: Urology

## 2021-03-23 NOTE — Anesthesia Postprocedure Evaluation (Signed)
Anesthesia Post Note  Patient: Natalie Schaefer  Procedure(s) Performed: CYSTOSCOPY WITH RETROGRADE PYELOGRAM/URETERAL STENT PLACEMENT (Left: Urethra)     Patient location during evaluation: PACU Anesthesia Type: General Level of consciousness: awake and alert and oriented Pain management: pain level controlled Vital Signs Assessment: post-procedure vital signs reviewed and stable Respiratory status: spontaneous breathing, nonlabored ventilation and respiratory function stable Cardiovascular status: blood pressure returned to baseline Postop Assessment: no apparent nausea or vomiting Anesthetic complications: no   No notable events documented.  Shanda Howells

## 2021-04-07 ENCOUNTER — Encounter (HOSPITAL_BASED_OUTPATIENT_CLINIC_OR_DEPARTMENT_OTHER): Payer: Self-pay | Admitting: Urology

## 2021-04-07 ENCOUNTER — Other Ambulatory Visit: Payer: Self-pay

## 2021-04-07 NOTE — Progress Notes (Signed)
Spoke w/ via phone for pre-op interview--- pt Lab needs dos----  urine preg             Lab results------ current lab work done 03-21-2021, CBCdiff/ CMP results in epic COVID test -----patient states asymptomatic no test needed Arrive at ------- 0800 on 04-13-2021 NPO after MN NO Solid Food.  Clear liquids from MN until--- 0700 Med rec completed Medications to take morning of surgery ----- Pepcid, if needed may take oxybutynin/ zofran Diabetic medication ----- n/a Patient instructed no nail polish to be worn day of surgery Patient instructed to bring photo id and insurance card day of surgery Patient aware to have Driver (ride ) / caregiver for 24 hours after surgery --mother, rachell ricarto Patient Special Instructions ----- n/a Pre-Op special Istructions ----- n/a Patient verbalized understanding of instructions that were given at this phone interview. Patient denies shortness of breath, chest pain, fever, cough at this phone interview.

## 2021-04-12 NOTE — Anesthesia Preprocedure Evaluation (Addendum)
Anesthesia Evaluation  Patient identified by MRN, date of birth, ID band Patient awake    Reviewed: Allergy & Precautions, NPO status , Patient's Chart, lab work & pertinent test results  Airway Mallampati: I  TM Distance: >3 FB Neck ROM: Full    Dental  (+) Teeth Intact, Dental Advisory Given   Pulmonary Current Smoker and Patient abstained from smoking.,    Pulmonary exam normal breath sounds clear to auscultation       Cardiovascular negative cardio ROS Normal cardiovascular exam Rhythm:Regular Rate:Normal     Neuro/Psych negative neurological ROS     GI/Hepatic Neg liver ROS, GERD  Medicated,  Endo/Other  negative endocrine ROS  Renal/GU negative Renal ROSLeft ureteral stone     Musculoskeletal  (+) Arthritis ,   Abdominal   Peds  Hematology negative hematology ROS (+)   Anesthesia Other Findings   Reproductive/Obstetrics                           Anesthesia Physical Anesthesia Plan  ASA: 2  Anesthesia Plan: General   Post-op Pain Management:    Induction: Intravenous  PONV Risk Score and Plan: 3 and Scopolamine patch - Pre-op, Midazolam, Dexamethasone, Ondansetron and Diphenhydramine  Airway Management Planned: LMA  Additional Equipment:   Intra-op Plan:   Post-operative Plan: Extubation in OR  Informed Consent: I have reviewed the patients History and Physical, chart, labs and discussed the procedure including the risks, benefits and alternatives for the proposed anesthesia with the patient or authorized representative who has indicated his/her understanding and acceptance.     Dental advisory given  Plan Discussed with: CRNA  Anesthesia Plan Comments:        Anesthesia Quick Evaluation

## 2021-04-13 ENCOUNTER — Other Ambulatory Visit: Payer: Self-pay

## 2021-04-13 ENCOUNTER — Ambulatory Visit (HOSPITAL_BASED_OUTPATIENT_CLINIC_OR_DEPARTMENT_OTHER): Payer: Medicaid Other | Admitting: Anesthesiology

## 2021-04-13 ENCOUNTER — Encounter (HOSPITAL_BASED_OUTPATIENT_CLINIC_OR_DEPARTMENT_OTHER)
Admission: RE | Disposition: A | Discharge status: Home / Self Care | Payer: Self-pay | Source: Ambulatory Visit | Attending: Urology

## 2021-04-13 ENCOUNTER — Encounter (HOSPITAL_BASED_OUTPATIENT_CLINIC_OR_DEPARTMENT_OTHER): Payer: Self-pay | Admitting: Urology

## 2021-04-13 ENCOUNTER — Ambulatory Visit (HOSPITAL_BASED_OUTPATIENT_CLINIC_OR_DEPARTMENT_OTHER)
Admission: RE | Admit: 2021-04-13 | Discharge: 2021-04-13 | Disposition: A | Discharge status: Home / Self Care | Payer: Medicaid Other | Source: Ambulatory Visit | Attending: Urology | Admitting: Urology

## 2021-04-13 DIAGNOSIS — Z885 Allergy status to narcotic agent status: Secondary | ICD-10-CM | POA: Diagnosis not present

## 2021-04-13 DIAGNOSIS — Z8616 Personal history of COVID-19: Secondary | ICD-10-CM | POA: Insufficient documentation

## 2021-04-13 DIAGNOSIS — F17210 Nicotine dependence, cigarettes, uncomplicated: Secondary | ICD-10-CM | POA: Insufficient documentation

## 2021-04-13 DIAGNOSIS — N201 Calculus of ureter: Secondary | ICD-10-CM | POA: Diagnosis not present

## 2021-04-13 HISTORY — DX: Other specified disorders of bladder: N32.89

## 2021-04-13 HISTORY — PX: CYSTOSCOPY/URETEROSCOPY/HOLMIUM LASER/STENT PLACEMENT: SHX6546

## 2021-04-13 HISTORY — DX: Unspecified osteoarthritis, unspecified site: M19.90

## 2021-04-13 HISTORY — DX: Gastro-esophageal reflux disease without esophagitis: K21.9

## 2021-04-13 HISTORY — DX: Calculus of ureter: N20.1

## 2021-04-13 HISTORY — DX: Other complications of anesthesia, initial encounter: T88.59XA

## 2021-04-13 HISTORY — DX: Dysuria: R30.0

## 2021-04-13 LAB — POCT PREGNANCY, URINE: Preg Test, Ur: NEGATIVE

## 2021-04-13 SURGERY — CYSTOSCOPY/URETEROSCOPY/HOLMIUM LASER/STENT PLACEMENT
Anesthesia: General | Site: Ureter | Laterality: Left

## 2021-04-13 MED ORDER — MIDAZOLAM HCL 2 MG/2ML IJ SOLN
INTRAMUSCULAR | Status: DC | PRN
  Administered 2021-04-13: 2 mg via INTRAVENOUS

## 2021-04-13 MED ORDER — DEXAMETHASONE SODIUM PHOSPHATE 4 MG/ML IJ SOLN
INTRAMUSCULAR | Status: DC | PRN
  Administered 2021-04-13: 10 mg via INTRAVENOUS

## 2021-04-13 MED ORDER — ACETAMINOPHEN 500 MG PO TABS: 1000.0000 mg | ORAL_TABLET | Freq: Once | ORAL | Status: DC

## 2021-04-13 MED ORDER — FAMOTIDINE 20 MG PO TABS
ORAL_TABLET | ORAL | Status: AC
  Filled 2021-04-13: qty 1

## 2021-04-13 MED ORDER — MIDAZOLAM HCL 2 MG/2ML IJ SOLN
INTRAMUSCULAR | Status: AC
  Filled 2021-04-13: qty 2

## 2021-04-13 MED ORDER — CEFAZOLIN SODIUM-DEXTROSE 2-4 GM/100ML-% IV SOLN
2.0000 g | Freq: Once | INTRAVENOUS | Status: AC
  Administered 2021-04-13: 2 g via INTRAVENOUS

## 2021-04-13 MED ORDER — LIDOCAINE HCL (CARDIAC) PF 100 MG/5ML IV SOSY
PREFILLED_SYRINGE | INTRAVENOUS | Status: DC | PRN
  Administered 2021-04-13: 80 mg via INTRAVENOUS

## 2021-04-13 MED ORDER — HYDROMORPHONE HCL 2 MG/ML IJ SOLN
INTRAMUSCULAR | Status: AC
  Filled 2021-04-13: qty 1

## 2021-04-13 MED ORDER — SCOPOLAMINE 1 MG/3DAYS TD PT72
1.0000 | MEDICATED_PATCH | Freq: Once | TRANSDERMAL | Status: DC
  Administered 2021-04-13: 1.5 mg via TRANSDERMAL

## 2021-04-13 MED ORDER — KETOROLAC TROMETHAMINE 30 MG/ML IJ SOLN
INTRAMUSCULAR | Status: DC | PRN
  Administered 2021-04-13: 30 mg via INTRAVENOUS

## 2021-04-13 MED ORDER — FAMOTIDINE 20 MG PO TABS
20.0000 mg | ORAL_TABLET | Freq: Once | ORAL | Status: AC
  Administered 2021-04-13: 20 mg via ORAL

## 2021-04-13 MED ORDER — SODIUM CHLORIDE 0.9 % IR SOLN
Status: DC | PRN
  Administered 2021-04-13: 3000 mL via INTRAVESICAL

## 2021-04-13 MED ORDER — ACETAMINOPHEN 500 MG PO TABS
ORAL_TABLET | ORAL | Status: AC
  Filled 2021-04-13: qty 2

## 2021-04-13 MED ORDER — ONDANSETRON HCL 4 MG/2ML IJ SOLN
INTRAMUSCULAR | Status: DC | PRN
  Administered 2021-04-13: 4 mg via INTRAVENOUS

## 2021-04-13 MED ORDER — PROPOFOL 10 MG/ML IV BOLUS
INTRAVENOUS | Status: DC | PRN
  Administered 2021-04-13: 170 mg via INTRAVENOUS

## 2021-04-13 MED ORDER — SCOPOLAMINE 1 MG/3DAYS TD PT72
MEDICATED_PATCH | TRANSDERMAL | Status: AC
  Filled 2021-04-13: qty 1

## 2021-04-13 MED ORDER — CEPHALEXIN 500 MG PO CAPS: 500.0000 mg | ORAL_CAPSULE | Freq: Two times a day (BID) | ORAL | 0 refills | Status: AC

## 2021-04-13 MED ORDER — PROPOFOL 10 MG/ML IV BOLUS
INTRAVENOUS | Status: AC
  Filled 2021-04-13: qty 60

## 2021-04-13 MED ORDER — CEFAZOLIN SODIUM-DEXTROSE 2-4 GM/100ML-% IV SOLN
INTRAVENOUS | Status: AC
  Filled 2021-04-13: qty 100

## 2021-04-13 MED ORDER — ACETAMINOPHEN 10 MG/ML IV SOLN
INTRAVENOUS | Status: DC | PRN
  Administered 2021-04-13: 1000 mg via INTRAVENOUS

## 2021-04-13 MED ORDER — ACETAMINOPHEN 10 MG/ML IV SOLN
INTRAVENOUS | Status: AC
  Filled 2021-04-13: qty 100

## 2021-04-13 MED ORDER — PROMETHAZINE HCL 25 MG/ML IJ SOLN: 6.2500 mg | INTRAMUSCULAR | Status: DC | PRN

## 2021-04-13 MED ORDER — IOHEXOL 300 MG/ML  SOLN
INTRAMUSCULAR | Status: DC | PRN
  Administered 2021-04-13: .001 mL

## 2021-04-13 MED ORDER — LACTATED RINGERS IV SOLN: INTRAVENOUS | Status: DC

## 2021-04-13 SURGICAL SUPPLY — 29 items
APL SKNCLS STERI-STRIP NONHPOA (GAUZE/BANDAGES/DRESSINGS)
BAG DRAIN URO-CYSTO SKYTR STRL (DRAIN) ×3 IMPLANT
BAG DRN UROCATH (DRAIN) ×1
BASKET STONE 1.7 NGAGE (UROLOGICAL SUPPLIES) IMPLANT
BASKET ZERO TIP NITINOL 2.4FR (BASKET) ×3 IMPLANT
BENZOIN TINCTURE PRP APPL 2/3 (GAUZE/BANDAGES/DRESSINGS) IMPLANT
BSKT STON RTRVL ZERO TP 2.4FR (BASKET) ×1
CATH URET 5FR 28IN OPEN ENDED (CATHETERS) ×2 IMPLANT
CLOSURE WOUND 1/2 X4 (GAUZE/BANDAGES/DRESSINGS)
CLOTH BEACON ORANGE TIMEOUT ST (SAFETY) ×3 IMPLANT
COVER DOME SNAP 22 D (MISCELLANEOUS) ×2 IMPLANT
FIBER LASER FLEXIVA 365 (UROLOGICAL SUPPLIES) IMPLANT
GLOVE SURG ENC MOIS LTX SZ7.5 (GLOVE) ×3 IMPLANT
GOWN STRL REUS W/TWL XL LVL3 (GOWN DISPOSABLE) ×3 IMPLANT
GUIDEWIRE STR DUAL SENSOR (WIRE) IMPLANT
GUIDEWIRE ZIPWRE .038 STRAIGHT (WIRE) ×3 IMPLANT
IV NS IRRIG 3000ML ARTHROMATIC (IV SOLUTION) ×3 IMPLANT
KIT TURNOVER CYSTO (KITS) ×3 IMPLANT
MANIFOLD NEPTUNE II (INSTRUMENTS) ×3 IMPLANT
NS IRRIG 500ML POUR BTL (IV SOLUTION) ×3 IMPLANT
PACK CYSTO (CUSTOM PROCEDURE TRAY) ×3 IMPLANT
STENT URET 6FRX24 CONTOUR (STENTS) ×3 IMPLANT
STRIP CLOSURE SKIN 1/2X4 (GAUZE/BANDAGES/DRESSINGS) IMPLANT
SYR 10ML LL (SYRINGE) ×3 IMPLANT
TRACTIP FLEXIVA PULS ID 200XHI (Laser) IMPLANT
TRACTIP FLEXIVA PULSE ID 200 (Laser) ×3
TUBE CONNECTING 12'X1/4 (SUCTIONS) ×1
TUBE CONNECTING 12X1/4 (SUCTIONS) ×1 IMPLANT
TUBING UROLOGY SET (TUBING) ×3 IMPLANT

## 2021-04-13 NOTE — Transfer of Care (Signed)
Immediate Anesthesia Transfer of Care Note  Patient: Natalie Schaefer  Procedure(s) Performed: CYSTOSCOPY/URETEROSCOPY/HOLMIUM LASER/STENT PLACEMENT (Left: Ureter)  Patient Location: PACU  Anesthesia Type:General  Level of Consciousness: awake, alert , oriented and patient cooperative  Airway & Oxygen Therapy: Patient Spontanous Breathing and Patient connected to nasal cannula oxygen  Post-op Assessment: Report given to RN and Post -op Vital signs reviewed and stable  Post vital signs: Reviewed and stable  Last Vitals:  Vitals Value Taken Time  BP    Temp    Pulse 83 04/13/21 1039  Resp 16 04/13/21 1039  SpO2 100 % 04/13/21 1039  Vitals shown include unvalidated device data.  Last Pain:  Vitals:   04/13/21 0907  TempSrc: Oral  PainSc: 3       Patients Stated Pain Goal: 5 (04/13/21 0907)  Complications: No notable events documented.

## 2021-04-13 NOTE — Anesthesia Postprocedure Evaluation (Signed)
Anesthesia Post Note  Patient: THERMA LASURE  Procedure(s) Performed: CYSTOSCOPY/URETEROSCOPY/HOLMIUM LASER/STENT PLACEMENT (Left: Ureter)     Patient location during evaluation: PACU Anesthesia Type: General Level of consciousness: awake and alert, awake and oriented Pain management: pain level controlled Vital Signs Assessment: post-procedure vital signs reviewed and stable Respiratory status: spontaneous breathing, nonlabored ventilation and respiratory function stable Cardiovascular status: blood pressure returned to baseline and stable Postop Assessment: no apparent nausea or vomiting Anesthetic complications: no   No notable events documented.  Last Vitals:  Vitals:   04/13/21 1100 04/13/21 1122  BP: 123/86 (!) 122/97  Pulse: 71 71  Resp: 10 12  Temp:  36.4 C  SpO2: 100% 100%    Last Pain:  Vitals:   04/13/21 1122  TempSrc:   PainSc: 0-No pain                 Cecile Hearing

## 2021-04-13 NOTE — Discharge Instructions (Addendum)
  Post Anesthesia Home Care Instructions  Activity: Get plenty of rest for the remainder of the day. A responsible individual must stay with you for 24 hours following the procedure.  For the next 24 hours, DO NOT: -Drive a car -Advertising copywriter -Drink alcoholic beverages -Take any medication unless instructed by your physician -Make any legal decisions or sign important papers.  Meals: Start with liquid foods such as gelatin or soup. Progress to regular foods as tolerated. Avoid greasy, spicy, heavy foods. If nausea and/or vomiting occur, drink only clear liquids until the nausea and/or vomiting subsides. Call your physician if vomiting continues.  Special Instructions/Symptoms: Your throat may feel dry or sore from the anesthesia or the breathing tube placed in your throat during surgery. If this causes discomfort, gargle with warm salt water. The discomfort should disappear within 24 hours.  If you had a scopolamine patch placed behind your ear for the management of post- operative nausea and/or vomiting:  1. The medication in the patch is effective for 72 hours, after which it should be removed.  Wrap patch in a tissue and discard in the trash. Wash hands thoroughly with soap and water. 2. You may remove the patch earlier than 72 hours if you experience unpleasant side effects which may include dry mouth, dizziness or visual disturbances. 3. Avoid touching the patch. Wash your hands with soap and water after contact with the patch.  Remove patch behind left ear by  Saturday, April 16, 2021.  CYSTOSCOPY HOME CARE INSTRUCTIONS  Activity: Rest for the remainder of the day.  Do not drive or operate equipment today.  You may resume normal activities in one to two days as instructed by your physician.   Meals: Drink plenty of liquids and eat light foods such as gelatin or soup this evening.  You may return to a normal meal plan tomorrow.  Return to Work: You may return to work in  one to two days or as instructed by your physician.  Special Instructions / Symptoms: Call your physician if any of these symptoms occur:   -persistent or heavy bleeding  -bleeding which continues after first few urination  -large blood clots that are difficult to pass  -urine stream diminishes or stops completely  -fever equal to or higher than 101 degrees Farenheit.  -cloudy urine with a strong, foul odor  -severe pain  Females should always wipe from front to back after elimination.  You may feel some burning pain when you urinate.  This should disappear with time.  Applying moist heat to the lower abdomen or a hot tub bath may help relieve the pain.    May take Tylenol at 4 PM as needed. OK to take wit Toradol of Ibuprofen.  Remove stent on Monday, April 18, 2021.

## 2021-04-13 NOTE — H&P (Signed)
Urology Preoperative H&P   Chief Complaint: Left ureteral stone  History of Present Illness: Natalie Schaefer is a 30 y.o. female with a 7 mm left ureteral stone requiring urgent stent placement on 03/21/2021 due to renal colic.  She is here today for definitive stone treatment.  She reports intermittent episodes of gross hematuria along with OAB symptoms since stent placement.  She denies interval UTIs, dysuria, fever/chills or significant flank pain.    Past Medical History:  Diagnosis Date   Arthritis    Bladder spasms    Complication of anesthesia    rash/ severe itching with fentanyl for surgery 03-21-2021 @[facility]    Dysuria    GERD (gastroesophageal reflux disease)    History of COVID-19 08/25/2020   positive result in care everywhere;  per pt mild to moderate symptoms that resolved   Left ureteral calculus     Past Surgical History:  Procedure Laterality Date   CYSTOSCOPY W/ URETERAL STENT PLACEMENT Left 03/21/2021   Procedure: CYSTOSCOPY WITH RETROGRADE PYELOGRAM/URETERAL STENT PLACEMENT;  Surgeon: 03/23/2021, MD;  Location: [facility];  Service: Urology;  Laterality: Left;    Allergies:  Allergies  Allergen Reactions   Fentanyl Itching and Rash    Per pt this happened with 03-21-2021 surgery @[facility]     History reviewed. No pertinent family history.  Social History:  reports that she has been smoking cigarettes. She has a 3.50 pack-year smoking history. She has never used smokeless tobacco. She reports that she does not currently use drugs after having used the following drugs: Marijuana. She reports that she does not drink alcohol.  ROS: A complete review of systems was performed.  All systems are negative except for pertinent findings as noted.  Physical Exam:  Vital signs in last 24 hours: Temp:  [98.1 F (36.7 C)] 98.1 F (36.7 C) (09/07 0907) Pulse Rate:  [84] 84 (09/07 0907) Resp:  [16] 16 (09/07 0907) BP: (128)/(81) 128/81 (09/07 0907) SpO2:  [100 %]  100 % (09/07 0907) Weight:  [73.3 kg] 73.3 kg (09/07 0907) Constitutional:  Alert and oriented, No acute distress Cardiovascular: Regular rate and rhythm, No JVD Respiratory: Normal respiratory effort, Lungs clear bilaterally GI: Abdomen is soft, nontender, nondistended, no abdominal masses GU: No CVA tenderness Lymphatic: No lymphadenopathy Neurologic: Grossly intact, no focal deficits Psychiatric: Normal mood and affect  Laboratory Data:  No results for input(s): WBC, HGB, HCT, PLT in the last 72 hours.  No results for input(s): NA, K, CL, GLUCOSE, BUN, CALCIUM, CREATININE in the last 72 hours.  Invalid input(s): CO3   Results for orders placed or performed during the hospital encounter of 04/13/21 (from the past 24 hour(s))  Pregnancy, urine POC     Status: None   Collection Time: 04/13/21  8:49 AM  Result Value Ref Range   Preg Test, Ur NEGATIVE NEGATIVE   No results found for this or any previous visit (from the past 240 hour(s)).  Renal Function: No results for input(s): CREATININE in the last 168 hours. CrCl cannot be calculated (Patient's most recent lab result is older than the maximum 21 days allowed.).  Radiologic Imaging: CLINICAL DATA:  Left flank pain.  Nausea, vomiting   EXAM: CT ABDOMEN AND PELVIS WITHOUT CONTRAST   TECHNIQUE: Multidetector CT imaging of the abdomen and pelvis was performed following the standard protocol without IV contrast.   COMPARISON:  03/16/2021   FINDINGS: Lower chest: No acute abnormality.   Hepatobiliary: No focal liver abnormality is seen. No gallstones,  gallbladder wall thickening, or biliary dilatation.   Pancreas: Unremarkable. No pancreatic ductal dilatation or surrounding inflammatory changes.   Spleen: Normal in size without focal abnormality.   Adrenals/Urinary Tract: Normal adrenal glands. 7 mm mid left ureteral calculus resulting in moderate left hydronephrosis. No other urolithiasis. No renal mass.  Decompressed bladder.   Stomach/Bowel: Stomach is within normal limits. Appendix appears normal. No evidence of bowel wall thickening, distention, or inflammatory changes.   Vascular/Lymphatic: No significant vascular findings are present. No enlarged abdominal or pelvic lymph nodes.   Reproductive: Uterus and bilateral adnexa are unremarkable.   Other: No abdominal wall hernia or abnormality. No abdominopelvic ascites.   Musculoskeletal: No acute or significant osseous findings. Bilateral L5 transverse processes articulate with the sacrum.   IMPRESSION: 1. A 7 mm mid left ureteral calculus resulting in moderate left hydronephrosis.     Electronically Signed   By: Elige Ko M.D.   On: 03/21/2021 17:44  I independently reviewed the above imaging studies.  Assessment and Plan Natalie Schaefer is a 30 y.o. female with a 7 mm left ureteral stone  The risks, benefits and alternatives of cystoscopy with LEFT ureteroscopy, laser lithotripsy and ureteral stent placement was discussed the patient.  Risks included, but are not limited to: bleeding, urinary tract infection, ureteral injury/avulsion, ureteral stricture formation, retained stone fragments, the possibility that multiple surgeries may be required to treat the stone(s), MI, stroke, PE and the inherent risks of general anesthesia.  The patient voices understanding and wishes to proceed.      Rhoderick Moody, MD 04/13/2021, 9:27 AM  [facility] Pager: [phone]

## 2021-04-13 NOTE — Anesthesia Procedure Notes (Signed)
Procedure Name: LMA Insertion Date/Time: 04/13/2021 9:53 AM Performed by: Earmon Phoenix, CRNA Pre-anesthesia Checklist: Patient identified, Emergency Drugs available, Suction available, Patient being monitored and Timeout performed Patient Re-evaluated:Patient Re-evaluated prior to induction Oxygen Delivery Method: Circle system utilized Preoxygenation: Pre-oxygenation with 100% oxygen Induction Type: IV induction Ventilation: Mask ventilation without difficulty LMA: LMA inserted LMA Size: 4.0 Number of attempts: 1 Placement Confirmation: CO2 detector, breath sounds checked- equal and bilateral and positive ETCO2 Tube secured with: Tape Dental Injury: Teeth and Oropharynx as per pre-operative assessment

## 2021-04-13 NOTE — Op Note (Signed)
Operative Note  Preoperative diagnosis:  1.  7 mm left ureteral calculus  Postoperative diagnosis: 1.  Same  Procedure(s): 1.  Cystoscopy with left ureteral stent exchange, left ureteroscopy and holmium laser lithotripsy  Surgeon: Rhoderick Moody, MD  Assistants:  None  Anesthesia:  General  Complications:  None  EBL: Less than 5 mL  Specimens: 1.  Previously placed left ureteral stent was removed intact, inspected and discarded 2.  Left ureteral stone fragments  Drains/Catheters: 1.  Left 6 French, 24 cm JJ stent with tether  Intraoperative findings:   Left mid ureteral calculus measuring approximately 7 mm  Indication:  Natalie Schaefer is a 30 y.o. female with an obstructing 7 mm left mid ureteral calculus that required urgent stent placement on 03/21/2021 due to renal colic.  She is here today for definitive stone treatment.  She has been consented for the above procedures, voices understanding and wishes to proceed.  Description of procedure:  After informed consent was obtained, the patient was brought to the operating room and general LMA anesthesia was administered. The patient was then placed in the dorsolithotomy position and prepped and draped in the usual sterile fashion. A timeout was performed. A 23 French rigid cystoscope was then inserted into the urethral meatus and advanced into the bladder under direct vision. A complete bladder survey revealed no intravesical pathology.  Her previously placed left ureteral stent was then grasped at its distal curl and retracted to the urethral meatus.  A Glidewire was then advanced through the lumen of the stent and up to the left renal pelvis, or fluoroscopic guidance.  The previously placed stent was then removed intact, inspected and discarded.    A semirigid ureteroscope was then advanced up the left ureter to the level of her obstructing stone.  A 200 m holmium laser was then used to fracture the stone into several  smaller pieces.  A 0 tip basket was then used to extract all stone fragments from the lumen of the left ureter.  The semirigid ureteroscope was then removed.  A 6 French, 24 cm JJ stent with tether was advanced over the wire and into good position within the left collecting system, confirming placement via fluoroscopy.  The patient's bladder was drained.  The tether the stent was tucked into the vaginal vault.  She tolerated the procedure well and was transferred to the postanesthesia in stable condition.  Plan: The patient has been instructed to remove her stent at 7 AM on 04/18/2021.  She will follow-up in 6 weeks for a left renal ultrasound.

## 2021-04-14 ENCOUNTER — Encounter (HOSPITAL_BASED_OUTPATIENT_CLINIC_OR_DEPARTMENT_OTHER): Payer: Self-pay | Admitting: Urology

## 2022-03-29 IMAGING — CT CT RENAL STONE PROTOCOL
2 of 4 series · 16 of 46 positions shown, 18 images · non-contrast
Comparison: 03/16/2021

CLINICAL DATA: Left flank pain.  Nausea, vomiting

EXAM:
CT ABDOMEN AND PELVIS WITHOUT CONTRAST
TECHNIQUE: Multidetector CT imaging of the abdomen and pelvis was performed
following the standard protocol without IV contrast.

[Series 2: axial st · axial · 0.71mm/px · z∈[-461,-46]mm · 13 of 95 slices shown, 15 images]
[im 6/95  soft-tissue]
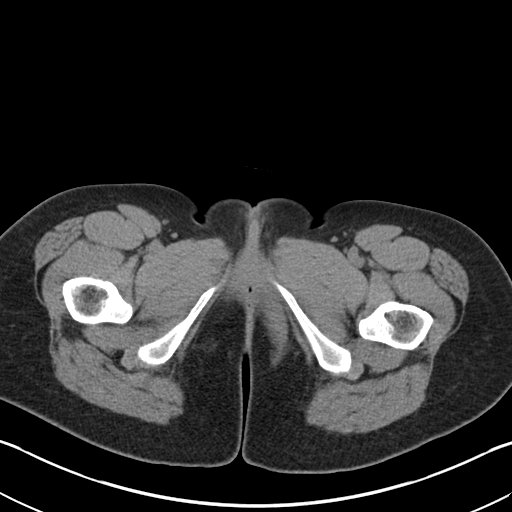
[im 6/95  bone]
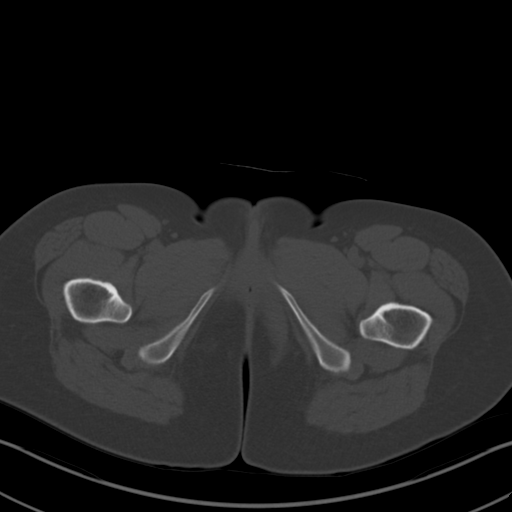
[im 12/95  soft-tissue]
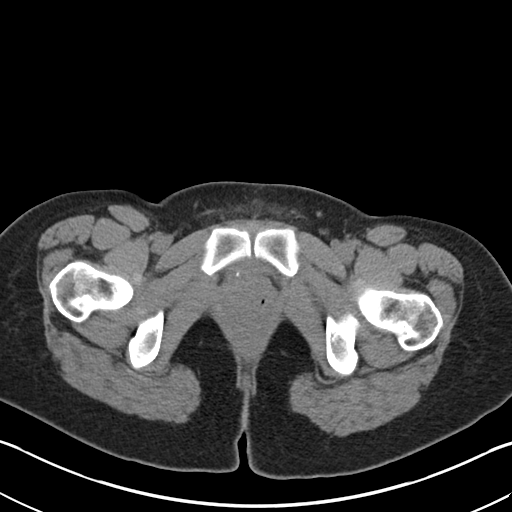
[im 23/95  soft-tissue]
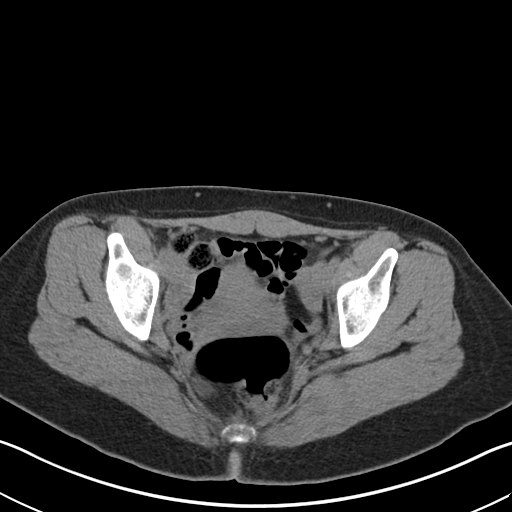
[im 28/95  soft-tissue]
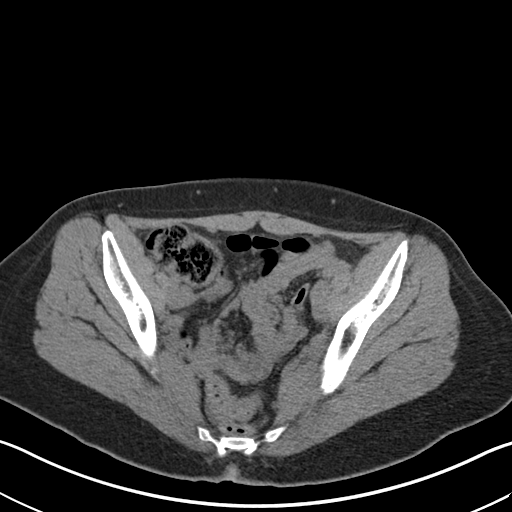
[im 34/95  soft-tissue]
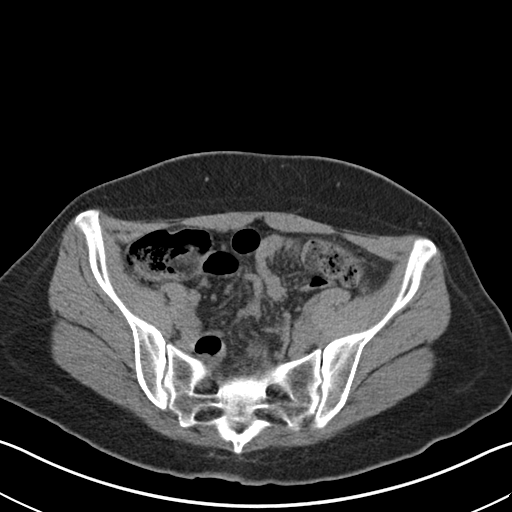
[im 39/95  soft-tissue]
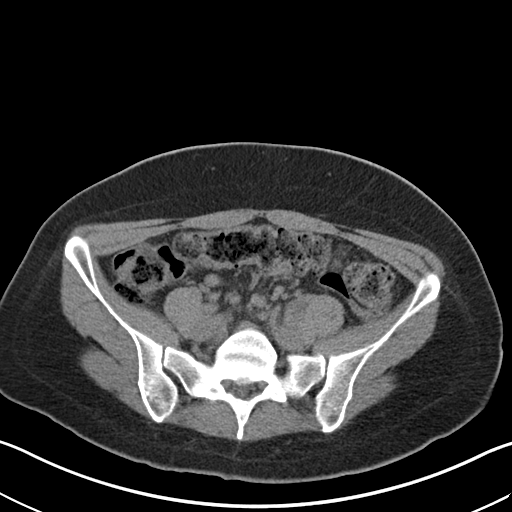
[im 50/95  soft-tissue]
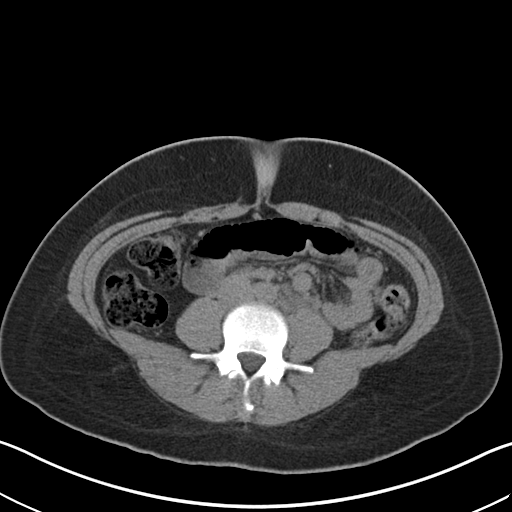
[im 56/95  soft-tissue]
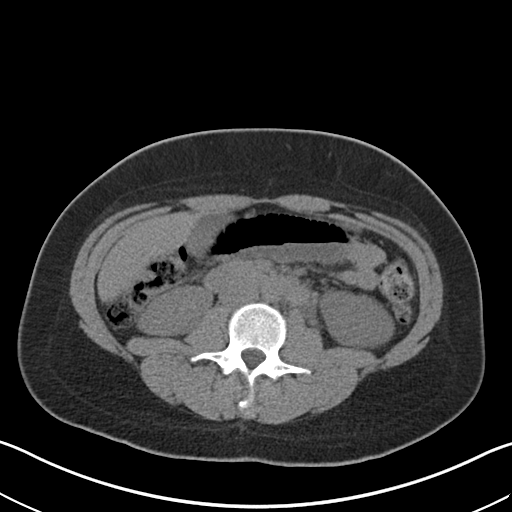
[im 61/95  soft-tissue]
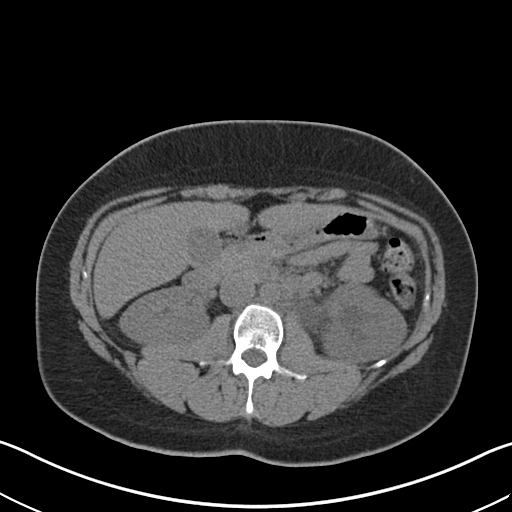
[im 61/95  bone]
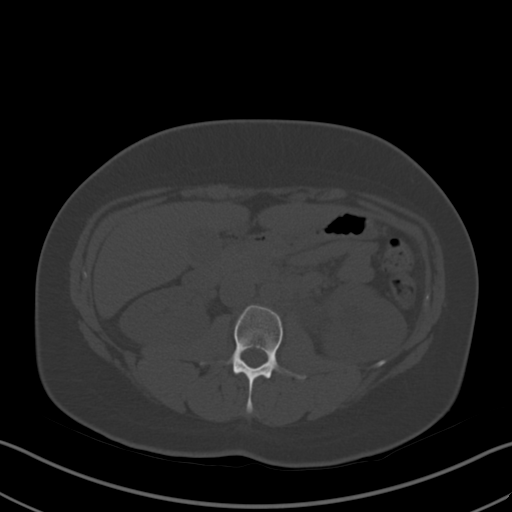
[im 67/95  soft-tissue]
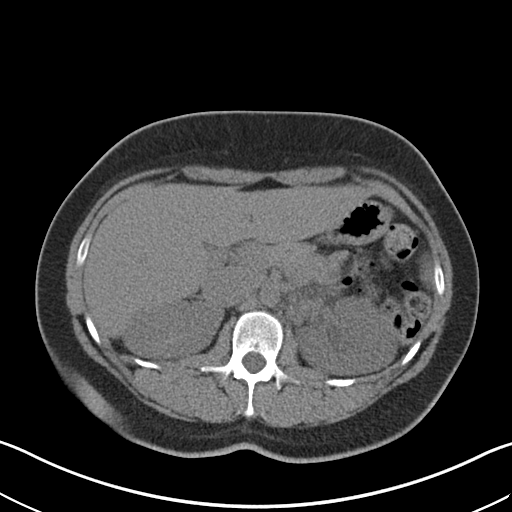
[im 72/95  soft-tissue]
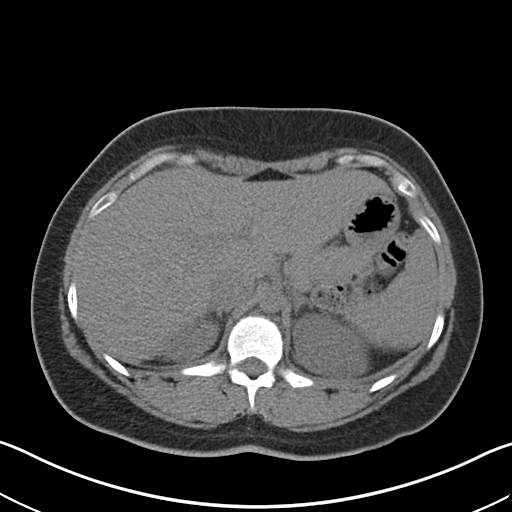
[im 83/95  soft-tissue]
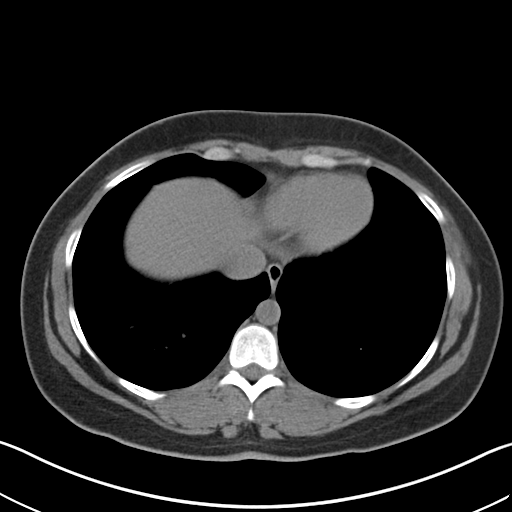
[im 89/95  soft-tissue]
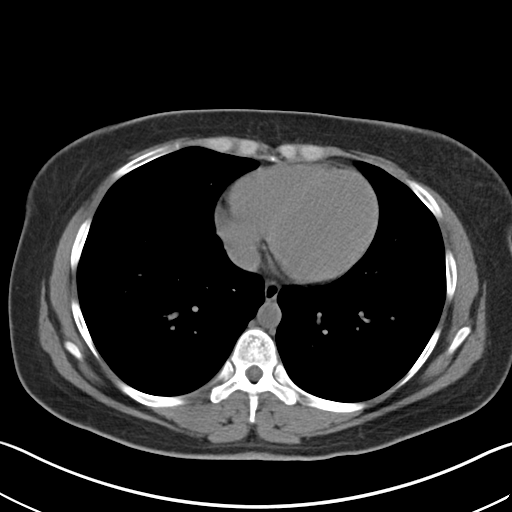

[Series 5: coronal · coronal · 0.88mm/px · 3 of 125 slices shown]
[im 42/125  soft-tissue]
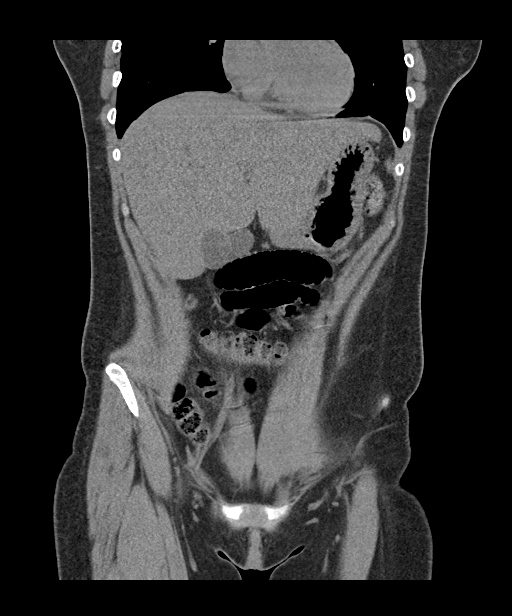
[im 56/125  soft-tissue]
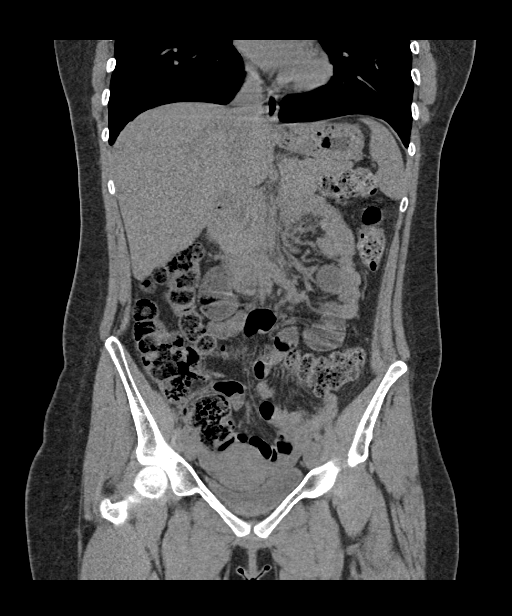
[im 69/125  soft-tissue]
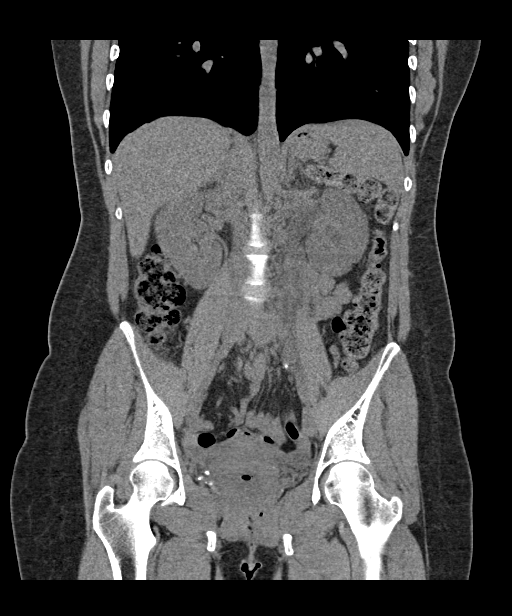

[16 of 46 positions shown; findings below may reference images not displayed]

FINDINGS: Lower chest: No acute abnormality.

Hepatobiliary: No focal liver abnormality is seen. No gallstones,
gallbladder wall thickening, or biliary dilatation.

Pancreas: Unremarkable. No pancreatic ductal dilatation or
surrounding inflammatory changes.

Spleen: Normal in size without focal abnormality.

Adrenals/Urinary Tract: Normal adrenal glands. 7 mm mid left
ureteral calculus resulting in moderate left hydronephrosis. No
other urolithiasis. No renal mass. Decompressed bladder.

Stomach/Bowel: Stomach is within normal limits. Appendix appears
normal. No evidence of bowel wall thickening, distention, or
inflammatory changes.

Vascular/Lymphatic: No significant vascular findings are present. No
enlarged abdominal or pelvic lymph nodes.

Reproductive: Uterus and bilateral adnexa are unremarkable.

Other: No abdominal wall hernia or abnormality. No abdominopelvic
ascites.

Musculoskeletal: No acute or significant osseous findings. Bilateral
L5 transverse processes articulate with the sacrum.
IMPRESSION: 1. A 7 mm mid left ureteral calculus resulting in moderate left
hydronephrosis.

## 2024-03-28 ENCOUNTER — Emergency Department (HOSPITAL_COMMUNITY)
Admission: EM | Admit: 2024-03-28 | Discharge: 2024-03-29 | Disposition: A | Discharge status: Home / Self Care | Attending: Emergency Medicine | Admitting: Emergency Medicine

## 2024-03-28 ENCOUNTER — Other Ambulatory Visit: Payer: Self-pay

## 2024-03-28 ENCOUNTER — Encounter (HOSPITAL_COMMUNITY): Payer: Self-pay | Admitting: Emergency Medicine

## 2024-03-28 DIAGNOSIS — U071 COVID-19: Secondary | ICD-10-CM | POA: Insufficient documentation

## 2024-03-28 DIAGNOSIS — R059 Cough, unspecified: Secondary | ICD-10-CM | POA: Diagnosis present

## 2024-03-28 NOTE — ED Triage Notes (Signed)
 Pt with c/o body aches since this am. 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 Pt here for Covid test.

## 2024-03-29 LAB — RESP PANEL BY RT-PCR (RSV, FLU A&B, COVID)  RVPGX2
Influenza A by PCR: NEGATIVE
Influenza B by PCR: NEGATIVE
Resp Syncytial Virus by PCR: NEGATIVE
SARS Coronavirus 2 by RT PCR: POSITIVE — AB

## 2024-03-29 MED ORDER — ONDANSETRON 4 MG PO TBDP: ORAL_TABLET | ORAL | 0 refills | Status: AC

## 2024-03-29 NOTE — ED Provider Notes (Signed)
 [facility]PART[facility] AT [facility] Provider Note   CSN: [id number] Arrival date & time: 03/28/24  2302     Patient presents with: Generalized Body Aches   Natalie Schaefer is a 33 y.o. female.   Patient reports flulike illness that started this morning.  Patient did have a couple episodes of vomiting earlier, no further nausea.  She reports congestion, cough.       Prior to Admission medications   Medication Sig Start Date End Date Taking? Authorizing Provider  ondansetron  (ZOFRAN -ODT) 4 MG disintegrating tablet 4mg  ODT q4 hours prn nausea/vomit 03/29/24  Yes Alija Riano, Lonni PARAS, MD  famotidine  (PEPCID ) 10 MG tablet Take 10 mg by mouth 2 (two) times daily as needed for heartburn or indigestion.    [provider]  ibuprofen  (ADVIL ) 200 MG tablet Take 400 mg by mouth every 6 (six) hours as needed.    [provider]  ketorolac  (TORADOL ) 10 MG tablet Take 1 tablet (10 mg total) by mouth every 6 (six) hours as needed. 03/21/21   Devere Lonni Righter, MD  medroxyPROGESTERone Acetate (DEPO-PROVERA IM) Inject into the muscle.    [provider]  oxybutynin  (DITROPAN ) 5 MG tablet Take 1 tablet (5 mg total) by mouth every 8 (eight) hours as needed for bladder spasms. 03/21/21   Devere Lonni Righter, MD  tamsulosin (FLOMAX) 0.4 MG CAPS capsule Take 0.4 mg by mouth at bedtime.    [provider]    Allergies: Fentanyl     Review of Systems  Updated Vital Signs BP 123/81 (BP Location: Right Arm)   Pulse 84   Temp 98.8 F (37.1 C) (Oral)   Resp 18   Ht 5' 5 (1.651 m)   Wt 70.8 kg   SpO2 100%   BMI 25.96 kg/m   Physical Exam Vitals and nursing note reviewed.  Constitutional:      General: She is not in acute distress.    Appearance: She is well-developed.  HENT:     Head: Normocephalic and atraumatic.     Mouth/Throat:     Mouth: Mucous membranes are moist.  Eyes:     General: Vision grossly intact. Gaze  aligned appropriately.     Extraocular Movements: Extraocular movements intact.     Conjunctiva/sclera: Conjunctivae normal.  Cardiovascular:     Rate and Rhythm: Normal rate and regular rhythm.     Pulses: Normal pulses.     Heart sounds: Normal heart sounds, S1 normal and S2 normal. No murmur heard.    No friction rub. No gallop.  Pulmonary:     Effort: Pulmonary effort is normal. No respiratory distress.     Breath sounds: Normal breath sounds.  Abdominal:     General: Bowel sounds are normal.     Palpations: Abdomen is soft.     Tenderness: There is no abdominal tenderness. There is no guarding or rebound.     Hernia: No hernia is present.  Musculoskeletal:        General: No swelling.     Cervical back: Full passive range of motion without pain, normal range of motion and neck supple. No spinous process tenderness or muscular tenderness. Normal range of motion.     Right lower leg: No edema.     Left lower leg: No edema.  Skin:    General: Skin is warm and dry.     Capillary Refill: Capillary refill takes less than 2 seconds.     Findings: No ecchymosis, erythema,  rash or wound.  Neurological:     General: No focal deficit present.     Mental Status: She is alert and oriented to person, place, and time.     GCS: GCS eye subscore is 4. GCS verbal subscore is 5. GCS motor subscore is 6.     Cranial Nerves: Cranial nerves 2-12 are intact.     Sensory: Sensation is intact.     Motor: Motor function is intact.     Coordination: Coordination is intact.  Psychiatric:        Attention and Perception: Attention normal.        Mood and Affect: Mood normal.        Speech: Speech normal.        Behavior: Behavior normal.     (all labs ordered are listed, but only abnormal results are displayed) Labs Reviewed  RESP PANEL BY RT-PCR (RSV, FLU A&B, COVID)  [id number] - Abnormal; Notable for the following components:      Result Value   SARS Coronavirus 2 by RT PCR POSITIVE (*)    All  other components within normal limits    EKG: None  Radiology: No results found.   Procedures   Medications Ordered in the ED - No data to display                                  Medical Decision Making  Patient appears well.  Lungs are clear.  Examination is fairly unremarkable, reassuring.  She is positive for COVID which explains her symptoms.     Final diagnoses:  COVID    ED Discharge Orders          Ordered    ondansetron  (ZOFRAN -ODT) 4 MG disintegrating tablet        03/29/24 0030               Haze Lonni PARAS, MD 03/29/24 0030
